# Patient Record
Sex: Female | Born: 2000 | Race: White | Hispanic: No | Marital: Single | Smoking: Current every day smoker
Health system: Southern US, Community
[De-identification: ages and names within clinical notes are randomized; demographics above are authoritative.]

---
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
## 2018-03-02 IMAGING — CR DG CHEST 2V
2 series · 2 of 2 positions shown · non-contrast
Comparison: 08/22/2016

CLINICAL DATA: Acute central chest pain for 3 days with shortness
of breath

EXAM:
CHEST  2 VIEW

[w chest pa]
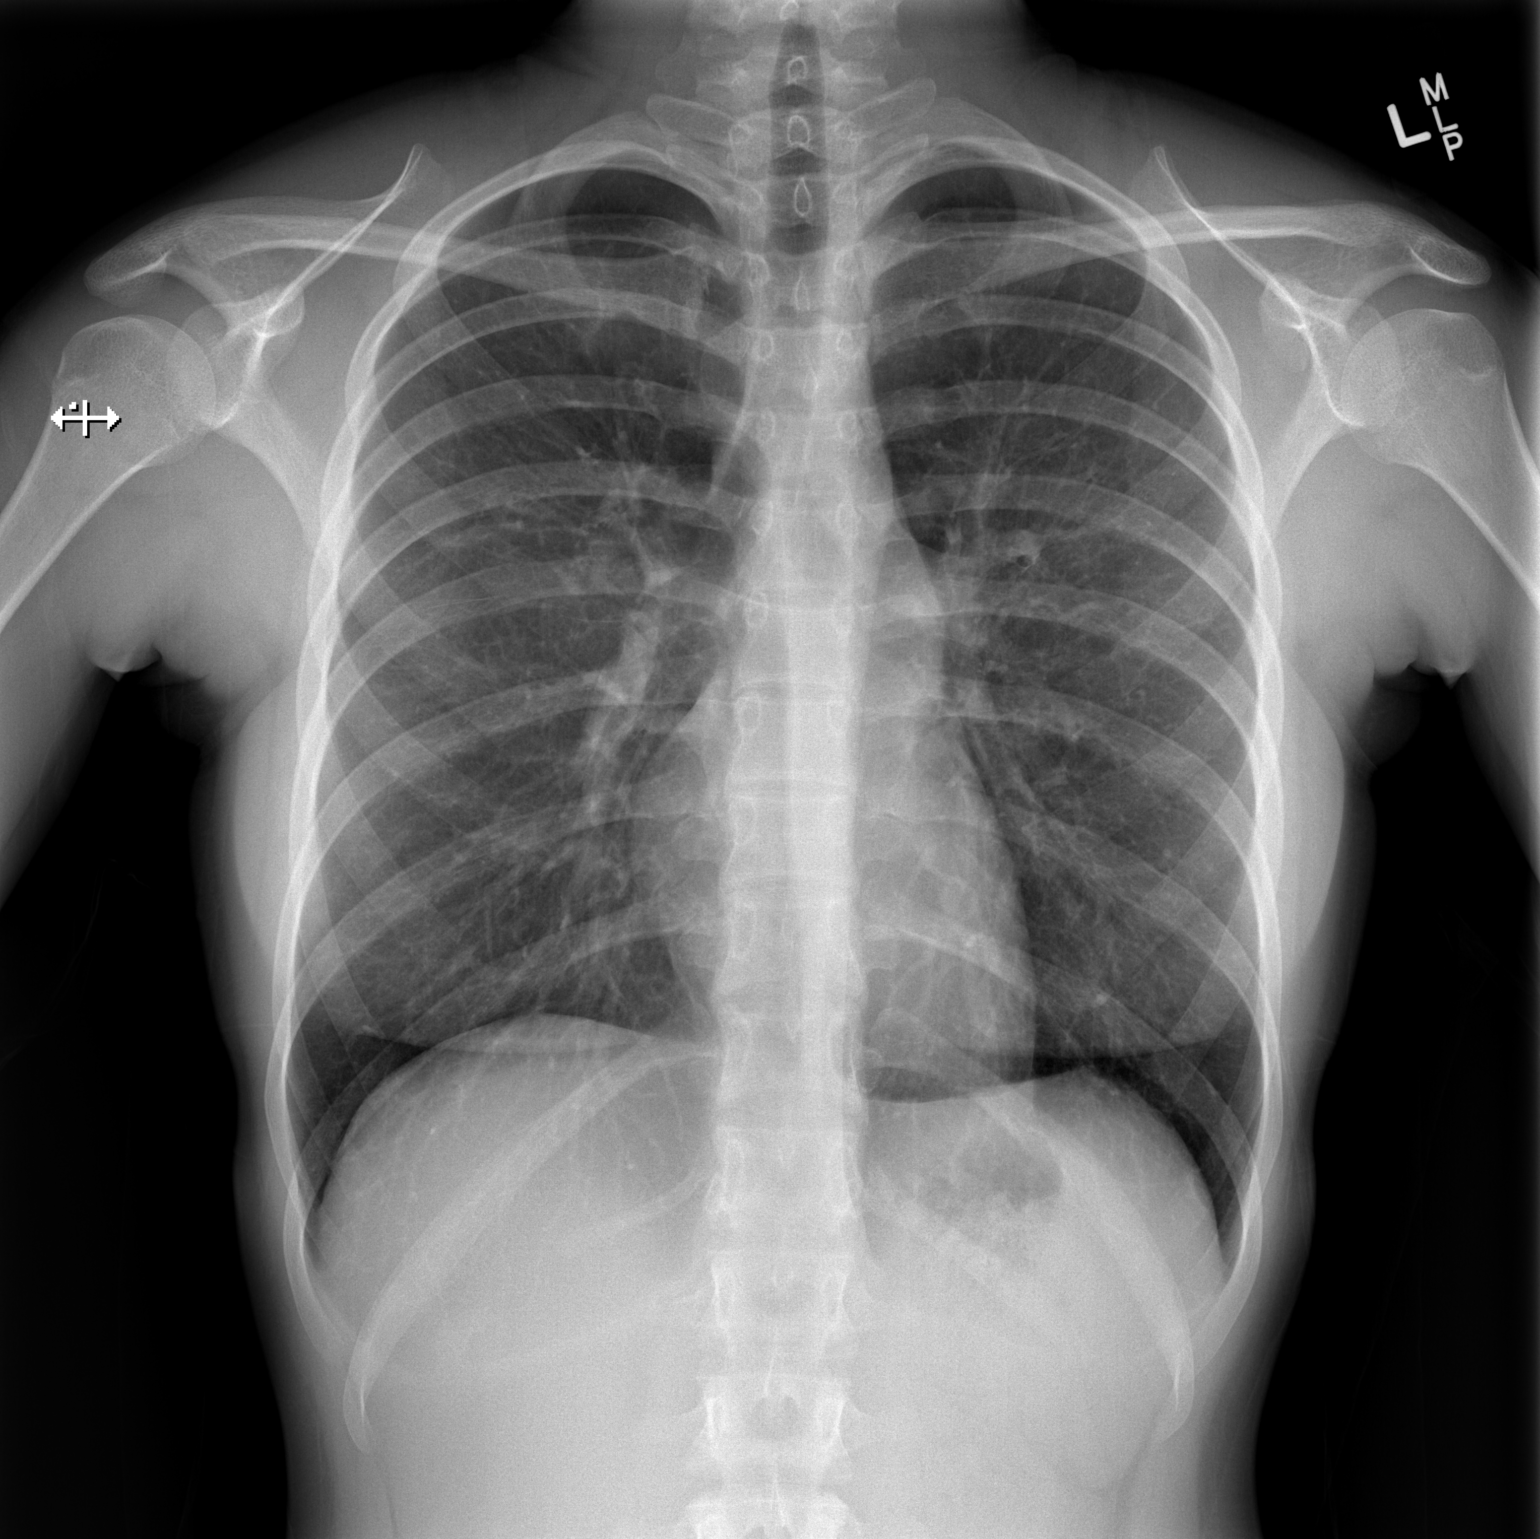

[w chest lat]
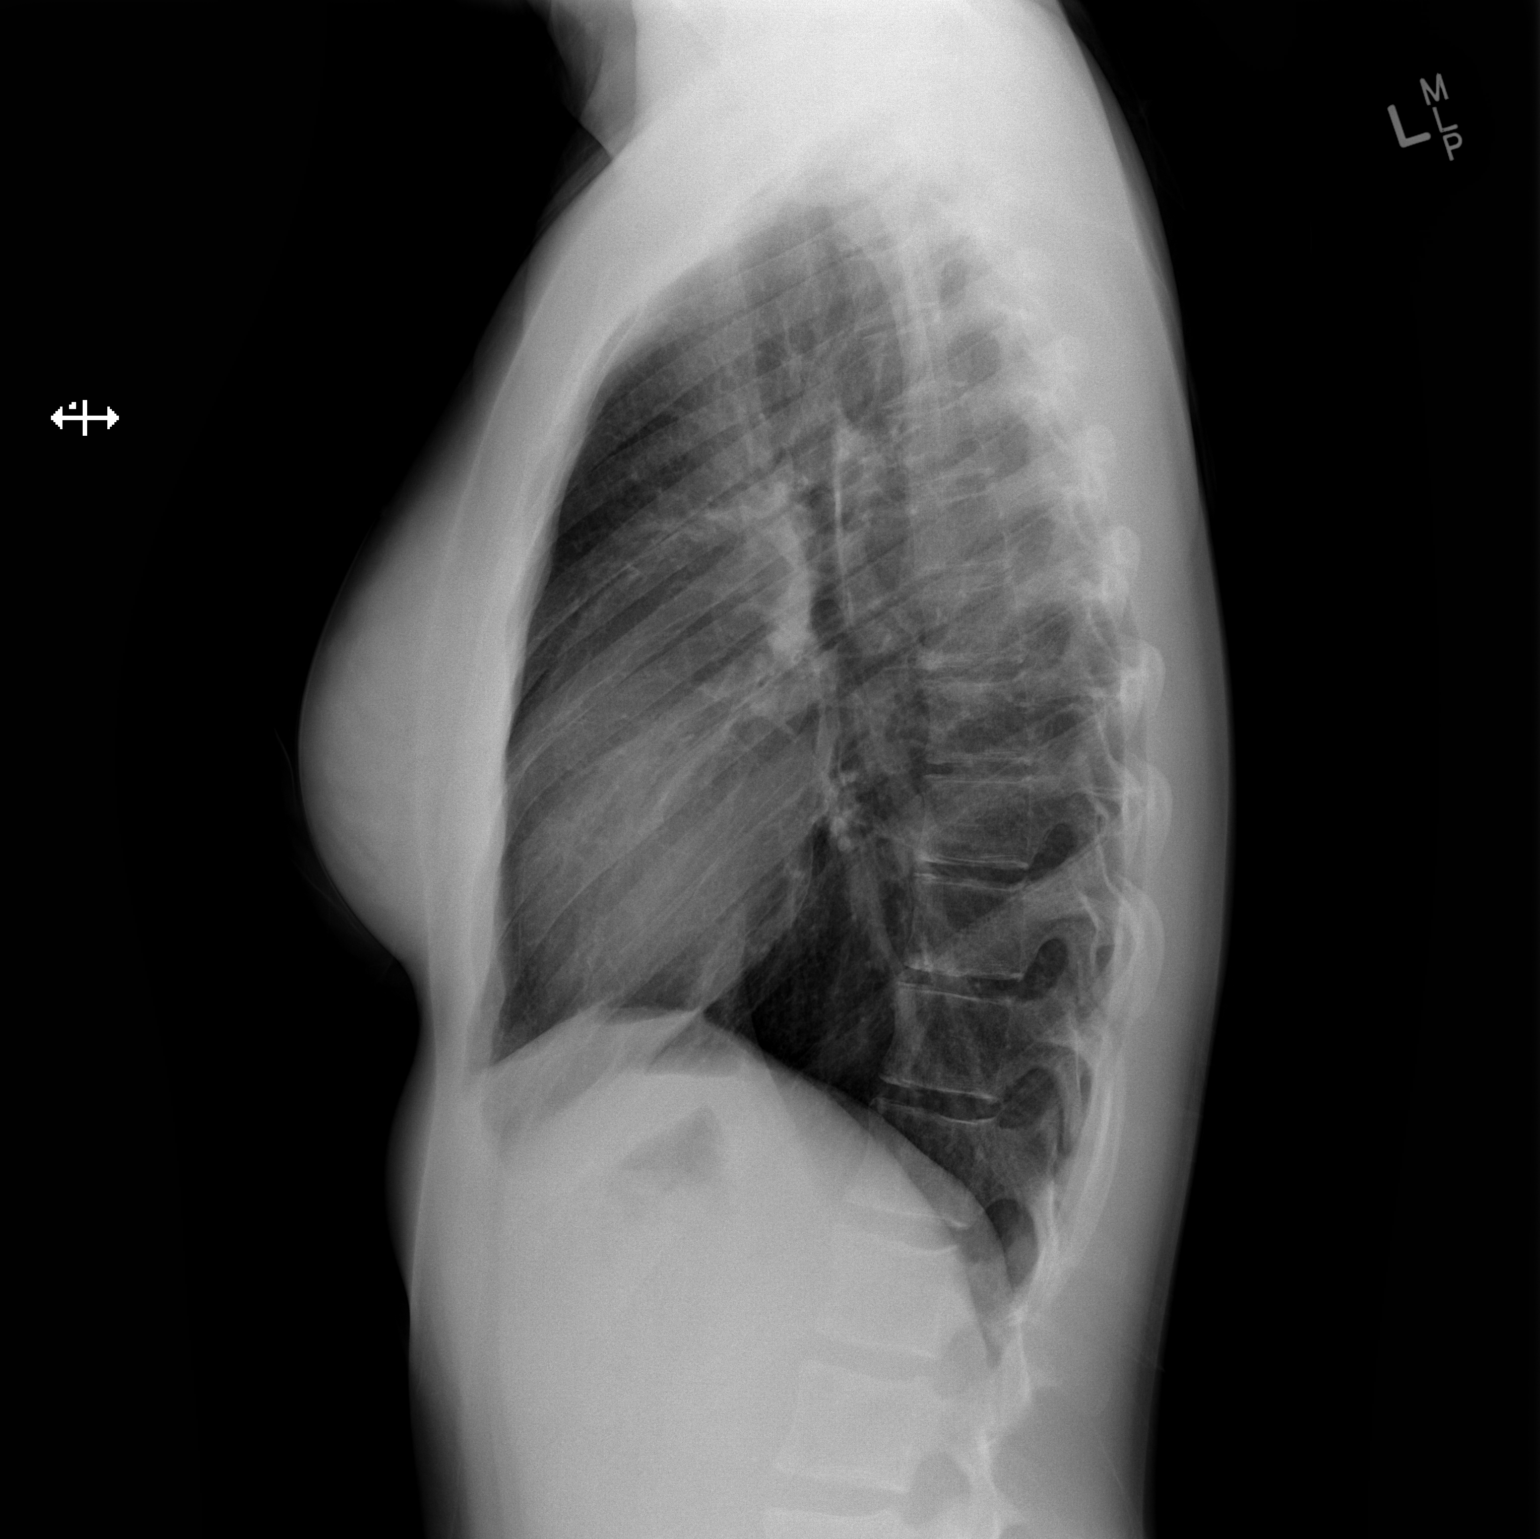

[2 of 2 positions shown; findings below may reference images not displayed]

FINDINGS: The heart size and mediastinal contours are within normal limits.
Both lungs are clear. The visualized skeletal structures are
unremarkable.
IMPRESSION: No active cardiopulmonary disease.
